# Patient Record
Sex: Male | Born: 1965 | Smoking: Never smoker
Health system: Southern US, Community
[De-identification: ages and names within clinical notes are randomized; demographics above are authoritative.]

---

## 2011-02-11 ENCOUNTER — Other Ambulatory Visit: Payer: Self-pay | Admitting: Internal Medicine

## 2011-02-11 ENCOUNTER — Ambulatory Visit
Admission: RE | Admit: 2011-02-11 | Discharge: 2011-02-11 | Disposition: A | Discharge status: Home / Self Care | Payer: No Typology Code available for payment source | Source: Ambulatory Visit | Attending: Internal Medicine | Admitting: Internal Medicine

## 2011-02-11 DIAGNOSIS — R7611 Nonspecific reaction to tuberculin skin test without active tuberculosis: Secondary | ICD-10-CM

## 2012-11-05 IMAGING — CR DG CHEST 2V
2 series · 2 of 2 positions shown · non-contrast
Comparison: None.

CLINICAL DATA: Positive PPD test.

CHEST - 2 VIEW

[w chest pa]
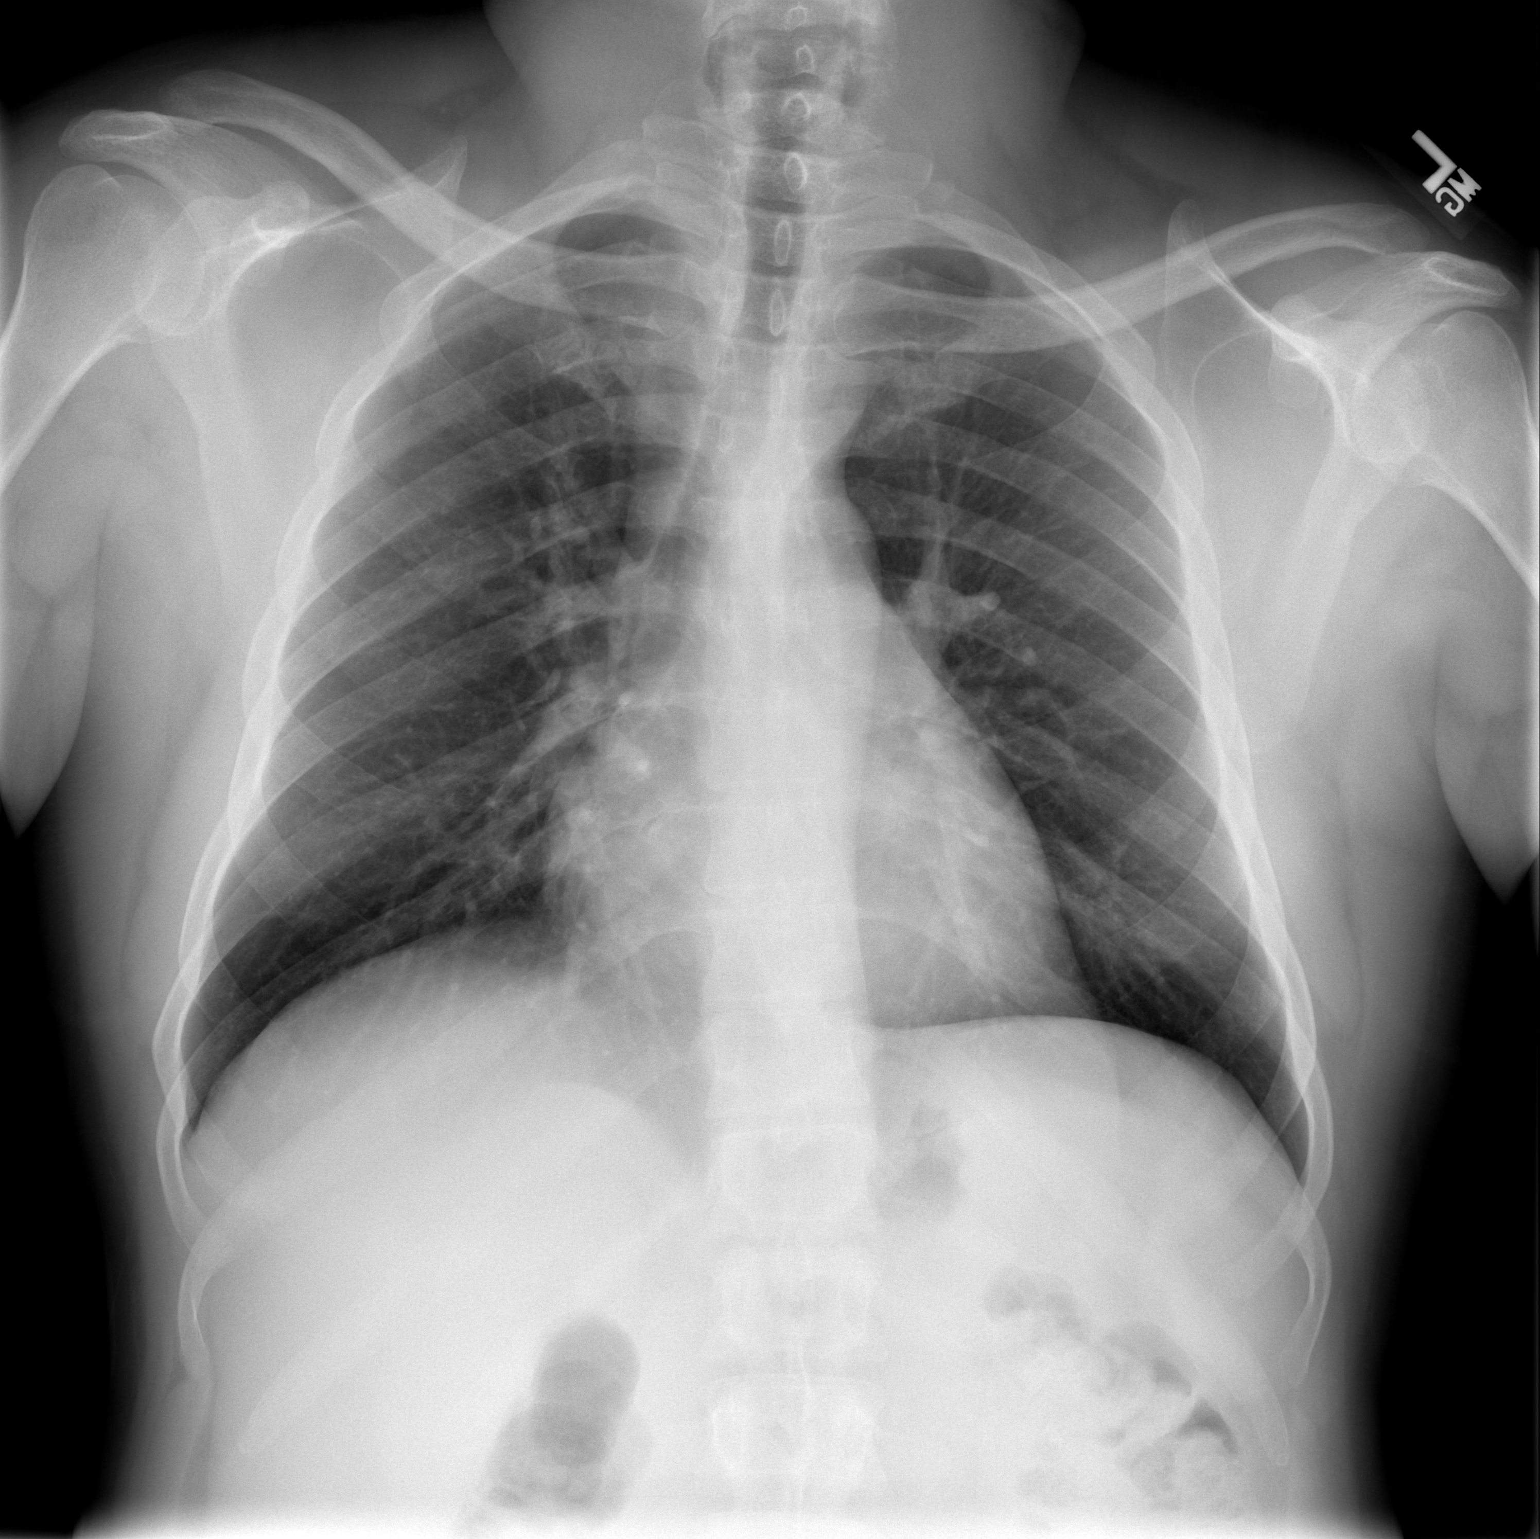

[w chest lat]
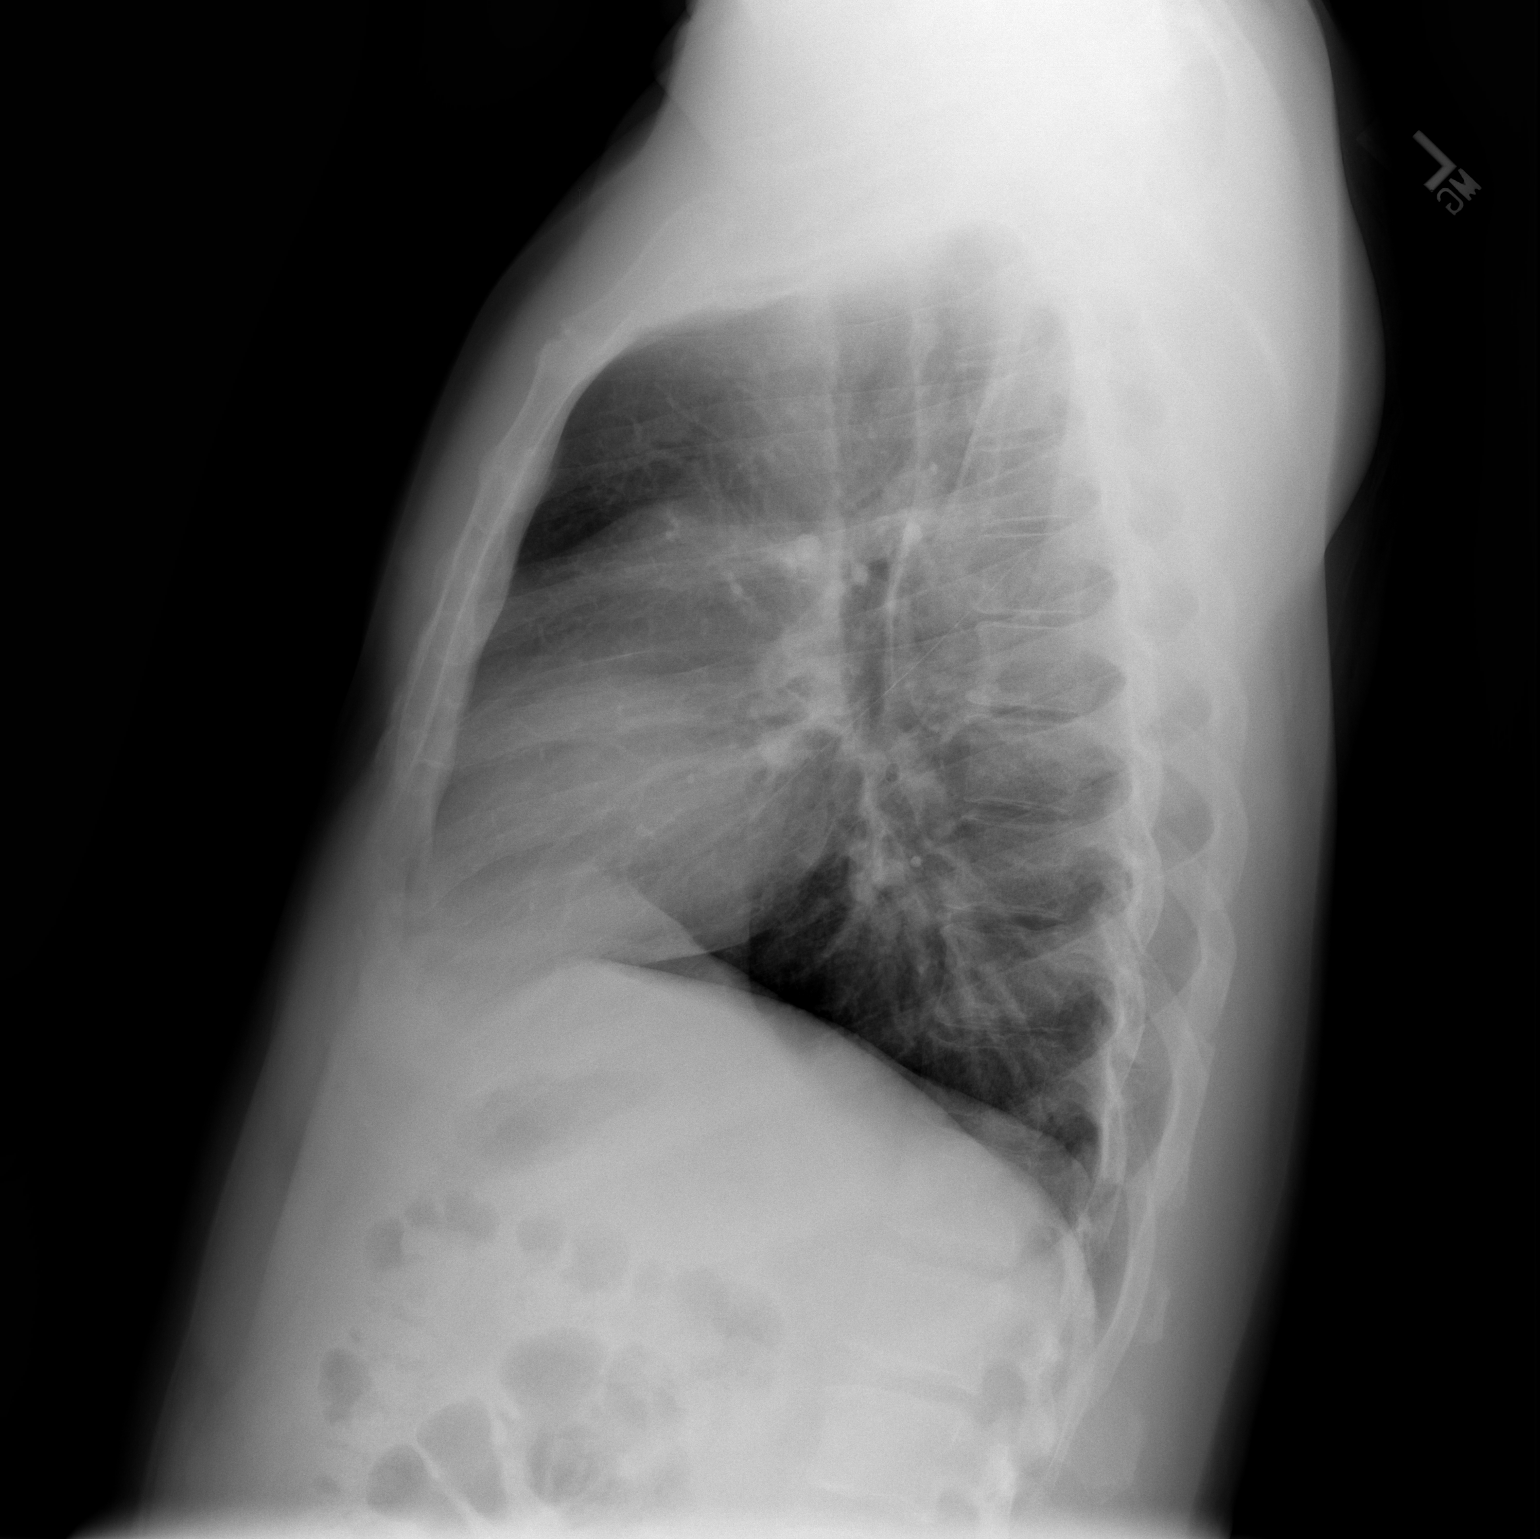

[2 of 2 positions shown; findings below may reference images not displayed]

FINDINGS: Trachea is midline.  Heart size normal.  Lungs are clear.
No pleural fluid.
IMPRESSION: No acute findings.  No evidence of active tuberculosis.

## 2016-11-10 ENCOUNTER — Ambulatory Visit (INDEPENDENT_AMBULATORY_CARE_PROVIDER_SITE_OTHER): Payer: Managed Care, Other (non HMO) | Admitting: Physician Assistant

## 2016-11-10 VITALS — BP 110/82 | HR 92 | Temp 98.9°F | Resp 16 | Ht 67.5 in | Wt 180.0 lb

## 2016-11-10 DIAGNOSIS — Z23 Encounter for immunization: Secondary | ICD-10-CM

## 2016-11-10 NOTE — Progress Notes (Signed)
   Gregory Stephens  MRN: [id number] DOB: 09/03/1966  Subjective:  Gregory Stephens is a 51 y.o. male seen in office today for a chief complaint of need of meningitis vaccine. He is traveling to [place] in March. He will be participating in [employer]. States he is up to date on his routine vaccines. Denies any other concerns. He declines malaria prophylaxis and typhoid vaccine today.   Review of Systems  Constitutional: Negative for chills, diaphoresis, fatigue and fever.  HENT: Negative for congestion.   Cardiovascular: Negative for chest pain and palpitations.  Gastrointestinal: Negative for abdominal pain, diarrhea, nausea and vomiting.    There are no active problems to display for this patient.   No current outpatient prescriptions on file prior to visit.   No current facility-administered medications on file prior to visit.     No Known Allergies   Objective:  BP 110/82   Pulse 92   Temp 98.9 F (37.2 C) (Oral)   Resp 16   Ht 5' 7.5" (1.715 m)   Wt 180 lb (81.6 kg)   SpO2 99%   BMI 27.78 kg/m   Physical Exam  Constitutional: He is oriented to person, place, and time and well-developed, well-nourished, and in no distress.  HENT:  Head: Normocephalic and atraumatic.  Eyes: Conjunctivae are normal.  Neck: Normal range of motion.  Cardiovascular: Normal rate, regular rhythm and normal heart sounds.   Pulmonary/Chest: Effort normal.  Neurological: He is alert and oriented to person, place, and time. Gait normal.  Skin: Skin is warm and dry.  Psychiatric: Affect normal.  Vitals reviewed.   Assessment and Plan :   1. Need for prophylactic vaccination and inoculation against single disease - Meningococcal conjugate vaccine 4-valent IM -Return to clinic as needed    Benjiman CoreBrittany Kieana Livesay PA-C  [facility] 11/10/2016 3:27 PM

## 2016-11-10 NOTE — Patient Instructions (Addendum)
   Have a safe trip! Thank you for letting me participate in your health and well being.   IF you received an x-ray today, you will receive an invoice from [facility]. Please contact [facility] at [phone] with questions or concerns regarding your invoice.   IF you received labwork today, you will receive an invoice from [facility]. Please contact [facility] at [phone] with questions or concerns regarding your invoice.   Our billing staff will not be able to assist you with questions regarding bills from these companies.  You will be contacted with the lab results as soon as they are available. The fastest way to get your results is to activate your My Chart account. Instructions are located on the last page of this paperwork. If you have not heard from us regarding the results in 2 weeks, please contact this office.

## 2016-11-11 ENCOUNTER — Encounter: Payer: Self-pay | Admitting: Emergency Medicine

## 2017-06-05 DIAGNOSIS — R69 Illness, unspecified: Secondary | ICD-10-CM | POA: Diagnosis not present

## 2017-08-28 DIAGNOSIS — E1165 Type 2 diabetes mellitus with hyperglycemia: Secondary | ICD-10-CM | POA: Diagnosis not present

## 2017-08-28 DIAGNOSIS — R030 Elevated blood-pressure reading, without diagnosis of hypertension: Secondary | ICD-10-CM | POA: Diagnosis not present

## 2017-08-28 DIAGNOSIS — E559 Vitamin D deficiency, unspecified: Secondary | ICD-10-CM | POA: Diagnosis not present

## 2017-08-28 DIAGNOSIS — Z1389 Encounter for screening for other disorder: Secondary | ICD-10-CM | POA: Diagnosis not present

## 2017-08-28 DIAGNOSIS — Z23 Encounter for immunization: Secondary | ICD-10-CM | POA: Diagnosis not present

## 2017-10-23 DIAGNOSIS — E1165 Type 2 diabetes mellitus with hyperglycemia: Secondary | ICD-10-CM | POA: Diagnosis not present

## 2017-12-27 DIAGNOSIS — Z7984 Long term (current) use of oral hypoglycemic drugs: Secondary | ICD-10-CM | POA: Diagnosis not present

## 2017-12-27 DIAGNOSIS — E119 Type 2 diabetes mellitus without complications: Secondary | ICD-10-CM | POA: Diagnosis not present

## 2017-12-27 DIAGNOSIS — E1165 Type 2 diabetes mellitus with hyperglycemia: Secondary | ICD-10-CM | POA: Diagnosis not present

## 2017-12-28 DIAGNOSIS — E1165 Type 2 diabetes mellitus with hyperglycemia: Secondary | ICD-10-CM | POA: Diagnosis not present

## 2018-06-29 DIAGNOSIS — E1165 Type 2 diabetes mellitus with hyperglycemia: Secondary | ICD-10-CM | POA: Diagnosis not present

## 2018-06-29 DIAGNOSIS — E559 Vitamin D deficiency, unspecified: Secondary | ICD-10-CM | POA: Diagnosis not present

## 2018-06-29 DIAGNOSIS — Z23 Encounter for immunization: Secondary | ICD-10-CM | POA: Diagnosis not present

## 2018-06-29 DIAGNOSIS — N529 Male erectile dysfunction, unspecified: Secondary | ICD-10-CM | POA: Diagnosis not present

## 2018-06-29 DIAGNOSIS — Z1389 Encounter for screening for other disorder: Secondary | ICD-10-CM | POA: Diagnosis not present

## 2018-06-29 DIAGNOSIS — Z0000 Encounter for general adult medical examination without abnormal findings: Secondary | ICD-10-CM | POA: Diagnosis not present

## 2018-06-29 DIAGNOSIS — Z125 Encounter for screening for malignant neoplasm of prostate: Secondary | ICD-10-CM | POA: Diagnosis not present

## 2018-10-15 DIAGNOSIS — D122 Benign neoplasm of ascending colon: Secondary | ICD-10-CM | POA: Diagnosis not present

## 2018-10-15 DIAGNOSIS — D120 Benign neoplasm of cecum: Secondary | ICD-10-CM | POA: Diagnosis not present

## 2018-10-15 DIAGNOSIS — D125 Benign neoplasm of sigmoid colon: Secondary | ICD-10-CM | POA: Diagnosis not present

## 2018-10-15 DIAGNOSIS — Z1211 Encounter for screening for malignant neoplasm of colon: Secondary | ICD-10-CM | POA: Diagnosis not present

## 2018-10-17 DIAGNOSIS — D120 Benign neoplasm of cecum: Secondary | ICD-10-CM | POA: Diagnosis not present

## 2018-10-17 DIAGNOSIS — D125 Benign neoplasm of sigmoid colon: Secondary | ICD-10-CM | POA: Diagnosis not present

## 2018-10-17 DIAGNOSIS — D122 Benign neoplasm of ascending colon: Secondary | ICD-10-CM | POA: Diagnosis not present
